# Patient Record
Sex: Female | Born: 1935 | Race: White | Hispanic: No | Smoking: Never smoker
Health system: Southern US, Community
[De-identification: ages and names within clinical notes are randomized; demographics above are authoritative.]

## PROBLEM LIST (undated history)

## (undated) DIAGNOSIS — J45909 Unspecified asthma, uncomplicated: Secondary | ICD-10-CM

## (undated) DIAGNOSIS — I509 Heart failure, unspecified: Secondary | ICD-10-CM

## (undated) DIAGNOSIS — I10 Essential (primary) hypertension: Secondary | ICD-10-CM

## (undated) DIAGNOSIS — I252 Old myocardial infarction: Secondary | ICD-10-CM

## (undated) DIAGNOSIS — Z8679 Personal history of other diseases of the circulatory system: Secondary | ICD-10-CM

## (undated) DIAGNOSIS — I219 Acute myocardial infarction, unspecified: Secondary | ICD-10-CM

## (undated) HISTORY — PX: TONSILLECTOMY: SUR1361

## (undated) HISTORY — PX: APPENDECTOMY: SHX54

## (undated) HISTORY — PX: ABDOMINAL HYSTERECTOMY: SHX81

## (undated) HISTORY — PX: CHOLECYSTECTOMY: SHX55

---

## 2012-09-26 HISTORY — PX: PACEMAKER PLACEMENT: SHX43

## 2012-09-26 HISTORY — PX: AORTIC VALVE REPLACEMENT: SHX41

## 2016-05-25 ENCOUNTER — Emergency Department: Payer: Medicare Other

## 2016-05-25 ENCOUNTER — Encounter: Payer: Self-pay | Admitting: Radiology

## 2016-05-25 ENCOUNTER — Ambulatory Visit (INDEPENDENT_AMBULATORY_CARE_PROVIDER_SITE_OTHER)
Admission: EM | Admit: 2016-05-25 | Discharge: 2016-05-25 | Discharge status: Against Medical Advice | Payer: Medicare Other | Source: Home / Self Care | Attending: Family Medicine | Admitting: Family Medicine

## 2016-05-25 ENCOUNTER — Emergency Department
Admission: EM | Admit: 2016-05-25 | Discharge: 2016-05-25 | Disposition: A | Discharge status: Home / Self Care | Payer: Medicare Other | Attending: Emergency Medicine | Admitting: Emergency Medicine

## 2016-05-25 DIAGNOSIS — M549 Dorsalgia, unspecified: Secondary | ICD-10-CM | POA: Diagnosis not present

## 2016-05-25 DIAGNOSIS — I25119 Atherosclerotic heart disease of native coronary artery with unspecified angina pectoris: Secondary | ICD-10-CM | POA: Insufficient documentation

## 2016-05-25 DIAGNOSIS — R071 Chest pain on breathing: Secondary | ICD-10-CM | POA: Diagnosis present

## 2016-05-25 DIAGNOSIS — Z950 Presence of cardiac pacemaker: Secondary | ICD-10-CM

## 2016-05-25 DIAGNOSIS — I110 Hypertensive heart disease with heart failure: Secondary | ICD-10-CM | POA: Insufficient documentation

## 2016-05-25 DIAGNOSIS — H9203 Otalgia, bilateral: Secondary | ICD-10-CM | POA: Diagnosis not present

## 2016-05-25 DIAGNOSIS — I509 Heart failure, unspecified: Secondary | ICD-10-CM | POA: Insufficient documentation

## 2016-05-25 DIAGNOSIS — R51 Headache: Secondary | ICD-10-CM | POA: Insufficient documentation

## 2016-05-25 DIAGNOSIS — Z954 Presence of other heart-valve replacement: Secondary | ICD-10-CM | POA: Diagnosis not present

## 2016-05-25 DIAGNOSIS — R0602 Shortness of breath: Secondary | ICD-10-CM | POA: Diagnosis not present

## 2016-05-25 DIAGNOSIS — R079 Chest pain, unspecified: Secondary | ICD-10-CM

## 2016-05-25 DIAGNOSIS — Z79899 Other long term (current) drug therapy: Secondary | ICD-10-CM | POA: Insufficient documentation

## 2016-05-25 DIAGNOSIS — Z952 Presence of prosthetic heart valve: Secondary | ICD-10-CM | POA: Insufficient documentation

## 2016-05-25 DIAGNOSIS — J45909 Unspecified asthma, uncomplicated: Secondary | ICD-10-CM | POA: Insufficient documentation

## 2016-05-25 DIAGNOSIS — R0789 Other chest pain: Secondary | ICD-10-CM

## 2016-05-25 HISTORY — DX: Old myocardial infarction: I25.2

## 2016-05-25 HISTORY — DX: Personal history of other diseases of the circulatory system: Z86.79

## 2016-05-25 HISTORY — DX: Unspecified asthma, uncomplicated: J45.909

## 2016-05-25 HISTORY — DX: Heart failure, unspecified: I50.9

## 2016-05-25 HISTORY — DX: Essential (primary) hypertension: I10

## 2016-05-25 LAB — BASIC METABOLIC PANEL
Anion gap: 8 (ref 5–15)
BUN: 17 mg/dL (ref 6–20)
CALCIUM: 9.4 mg/dL (ref 8.9–10.3)
CO2: 25 mmol/L (ref 22–32)
CREATININE: 0.7 mg/dL (ref 0.44–1.00)
Chloride: 107 mmol/L (ref 101–111)
GFR calc Af Amer: >60 mL/min (ref >60)
GFR calc non Af Amer: >60 mL/min (ref >60)
GLUCOSE: 106 mg/dL — AB (ref 65–99)
Potassium: 3.8 mmol/L (ref 3.5–5.1)
Sodium: 140 mmol/L (ref 135–145)

## 2016-05-25 LAB — CBC
HCT: 37.2 % (ref 35.0–47.0)
Hemoglobin: 12.9 g/dL (ref 12.0–16.0)
MCH: 30.5 pg (ref 26.0–34.0)
MCHC: 34.6 g/dL (ref 32.0–36.0)
MCV: 88.2 fL (ref 80.0–100.0)
PLATELETS: 156 10*3/uL (ref 150–440)
RBC: 4.22 MIL/uL (ref 3.80–5.20)
RDW: 15.4 % — ABNORMAL HIGH (ref 11.5–14.5)
WBC: 6 10*3/uL (ref 3.6–11.0)

## 2016-05-25 LAB — TROPONIN I: Troponin I: <0.03 ng/mL (ref <0.03)

## 2016-05-25 LAB — BRAIN NATRIURETIC PEPTIDE: B Natriuretic Peptide: 575 pg/mL — ABNORMAL HIGH (ref 0.0–100.0)

## 2016-05-25 MED ORDER — IOPAMIDOL (ISOVUE-370) INJECTION 76%
75.0000 mL | Freq: Once | INTRAVENOUS | Status: AC | PRN
  Administered 2016-05-25: 75 mL via INTRAVENOUS

## 2016-05-25 MED ORDER — ALBUTEROL SULFATE HFA 108 (90 BASE) MCG/ACT IN AERS: 2.0000 | INHALATION_SPRAY | Freq: Four times a day (QID) | RESPIRATORY_TRACT | 0 refills | Status: AC | PRN

## 2016-05-25 MED ORDER — DOXYCYCLINE HYCLATE 100 MG PO CAPS: 100.0000 mg | ORAL_CAPSULE | Freq: Two times a day (BID) | ORAL | 8 refills | Status: DC

## 2016-05-25 NOTE — ED Triage Notes (Signed)
Patient reports that over the last few days she has not been feeling herself. Patient states that she started with cough, sore throat. Patient states that she has been having chest pain that radiates through to her back and describes this as a pressure. Patient states that she does have A-fib and she has had an Aortic Valve Replacement done in 2014. 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 Patient states that she has been having some shortness of breath as well.

## 2016-05-25 NOTE — ED Triage Notes (Signed)
Pt reports this weekend started with earaches, chest pain, SOB, cough and pain to her shoulder and back. Pt reports went to UC to rule out sinus infection but was advised to come to the ED. Pt reports has cardiac history and intermittently will have the CP and SOB.

## 2016-05-25 NOTE — Discharge Instructions (Signed)
Explained to patient that due to her cardiac history, would recommend evaluation at emergency department; patient refuses transport by ambulance

## 2016-05-25 NOTE — Discharge Instructions (Signed)
Please seek medical attention for any high fevers, chest pain, shortness of breath, change in behavior, persistent vomiting, bloody stool or any other new or concerning symptoms.  

## 2016-05-25 NOTE — ED Provider Notes (Signed)
[facility]    CSN: [id number] Arrival date & time: 05/25/16  1043  First Provider Contact:  First MD Initiated Contact with Patient 05/25/16 1104        History   Chief Complaint Chief Complaint  Patient presents with  . Chest Pain    HPI Deanna HaggisCarol Yates is a 81 y.o. female.   81 yo female with a c/o of sinus congestion and "chest pressure" in the middle of the chest since yesterday, associated with shortness of breath with exertion. Patient also states she's also been feeling palpitations.  Patient has a h/o CAD (s/p MI in 2014), s/p aortic valve replacement and pacemaker in 2014 in [place].   The history is provided by the patient.  Chest Pain    Past Medical History:  Diagnosis Date  . History of atrial fibrillation   . History of myocardial infarction     There are no active problems to display for this patient.   Past Surgical History:  Procedure Laterality Date  . ABDOMINAL HYSTERECTOMY    . AORTIC VALVE REPLACEMENT  2014  . APPENDECTOMY    . CHOLECYSTECTOMY    . PACEMAKER PLACEMENT  2014  . TONSILLECTOMY      OB History    No data available       Home Medications    Prior to Admission medications   Medication Sig Start Date End Date Taking? Authorizing Provider  atorvastatin (LIPITOR) 20 MG tablet Take 20 mg by mouth daily.   Yes Historical Provider, MD  furosemide (LASIX) 40 MG tablet Take 40 mg by mouth 3 (three) times a week.   Yes Historical Provider, MD  lisinopril (PRINIVIL,ZESTRIL) 5 MG tablet Take 5 mg by mouth daily.   Yes Historical Provider, MD  omeprazole (PRILOSEC) 20 MG capsule Take 20 mg by mouth daily.   Yes Historical Provider, MD  potassium chloride (MICRO-K) 10 MEQ CR capsule Take 10 mEq by mouth 2 (two) times daily.   Yes Historical Provider, MD  sotalol (BETAPACE) 120 MG tablet Take 120 mg by mouth 2 (two) times daily.   Yes Historical Provider, MD    Family History No family history on file.  Social  History Social History  Substance Use Topics  . Smoking status: Never Smoker  . Smokeless tobacco: Never Used  . Alcohol use No     Allergies   Cortizone-10 [hydrocortisone] and Penicillins   Review of Systems Review of Systems  Cardiovascular: Positive for chest pain.     Physical Exam Triage Vital Signs ED Triage Vitals  Enc Vitals Group     BP 05/25/16 1055 (!) 145/58     Pulse Rate 05/25/16 1055 92     Resp 05/25/16 1055 16     Temp 05/25/16 1055 98.2 F (36.8 C)     Temp Source 05/25/16 1055 Oral     SpO2 05/25/16 1055 99 %     Weight 05/25/16 1057 165 lb (74.8 kg)     Height 05/25/16 1057 5\' 2"  (1.575 m)     Head Circumference --      Peak Flow --      Pain Score 05/25/16 1101 8     Pain Loc --      Pain Edu? --      Excl. in GC? --    No data found.   Updated Vital Signs BP (!) 145/58 (BP Location: Left Arm)   Pulse 92   Temp 98.2 F (36.8 C) (Oral)  Resp 16   Ht 5\' 2"  (1.575 m)   Wt 165 lb (74.8 kg)   SpO2 99%   BMI 30.18 kg/m   Visual Acuity Right Eye Distance:   Left Eye Distance:   Bilateral Distance:    Right Eye Near:   Left Eye Near:    Bilateral Near:     Physical Exam  Constitutional: She appears well-developed. No distress.  Neck: Neck supple.  Cardiovascular: Normal rate, normal heart sounds and intact distal pulses.   Pulmonary/Chest: Effort normal and breath sounds normal. No respiratory distress. She has no wheezes. She has no rales. She exhibits no tenderness.  Musculoskeletal: She exhibits no edema.  Skin: She is not diaphoretic.  Nursing note and vitals reviewed.    UC Treatments / Results  Labs (all labs ordered are listed, but only abnormal results are displayed) Labs Reviewed - No data to display  EKG  EKG Interpretation None       Radiology Dg Chest 2 View  Result Date: 05/25/2016 CLINICAL DATA:  81 year old female with increasing shortness of breath, congestion wheezing for the past week EXAM: CHEST   2 VIEW COMPARISON:  None. FINDINGS: Cardiomegaly. Mediastinal contours are unremarkable. Surgical changes of prior aortic valve replacement. There is a left subclavian approach cardiac rhythm maintenance device with leads projecting over the right atrium and right ventricle. Additional surgical clips are present in the right hilum. Mild central bronchitic changes and interstitial prominence without evidence of pulmonary edema. No suspicious pulmonary mass or nodule. No pleural effusion. This exaggerated thoracic kyphosis. Surgical clips in a right upper quadrant consistent with prior cholecystectomy. Atherosclerotic calcifications in the transverse aorta. IMPRESSION: 1. Central bronchitic change and mild interstitial prominence favored to reflect chronic parenchymal changes. 2. Cardiomegaly without pulmonary edema. 3.  Aortic Atherosclerosis (ICD10-170.0) 4. Surgical changes of prior aortic valve replacement. 5. Left subclavian approach cardiac rhythm maintenance device. Electronically Signed   By: Malachy Moan M.D.   On: 05/25/2016 12:54    Procedures .EKG Date/Time: 05/25/2016 1:28 PM Performed by: Payton Mccallum Authorized by: Payton Mccallum   ECG reviewed by ED Physician in the absence of a cardiologist: yes   Previous ECG:    Previous ECG:  Unavailable Interpretation:    Interpretation: non-specific   Rate:    ECG rate assessment: normal   Rhythm:    Rhythm: paced   Pacing:    Capture:  Complete   Type of pacing:  Ventricular Ectopy:    Ectopy: none     (including critical care time)  Medications Ordered in UC Medications - No data to display   Initial Impression / Assessment and Plan / UC Course  I have reviewed the triage vital signs and the nursing notes.  Pertinent labs & imaging results that were available during my care of the patient were reviewed by me and considered in my medical decision making (see chart for details).  Clinical Course      Final Clinical  Impressions(s) / UC Diagnoses   Final diagnoses:  Chest tightness or pressure  Coronary artery disease with unspecified angina pectoris  H/O aortic valve replacement  Pacemaker    New Prescriptions Discharge Medication List as of 05/25/2016 11:27 AM      1. EKG results and diagnosis reviewed with patient. Discussed with patient that due to her extensive cardiac history and some of her current symptoms, would recommend further evaluation in the emergency department. Patient refuses transport by ambulance. States will have a friend drive her  over in private vehicle. Verbalizes understanding of potential risks, complications.    Payton Mccallum, MD 05/25/16 [phone]

## 2016-05-25 NOTE — ED Provider Notes (Signed)
[facility] Emergency Department Provider Note  ____________________________________________  Time seen: Approximately 1:10 PM  I have reviewed the triage vital signs and the nursing notes.   HISTORY  Chief Complaint Chest Pain; Shortness of Breath; and Otalgia   HPI Deanna Yates is a 81 y.o. female the history of paroxysmal atrial fibrillation, CAD, aortic valve replacement, mild asthma who presents for evaluation of chest pain. Patient reports that she has had a week of frontal headaches and b/l earache similar to her prior episodes of sinusitis. She reports 3 days of pleuritic chest pain and upper back pain. Patient reports the pain started after she returned from a trip to [place]. She drove to and from [place] and returned 3 days ago. She reports that the pain is dull and strong, located in her upper back between her shoulder blades, radiating to her chest and associated with shortness of breath and chest heaviness. She reports the pain is worse with minimal exacerbation and she has been feeling very short of breath just walking to the bathroom which is unusual for her. She denies any prior history of pulmonary embolism or DVT, and exogenous hormones, leg pain or swelling, or hemoptysis. She also denies any history of cancer. Patient has been taking her Lasix as prescribed however reports that she's been using 2 pillows at home to sleep as she finishes it severely short of breath when laying flat. She denies weight gain. She went to urgent care today and was sent here for further evaluation. She denies fever, chills, bodyaches, cough, sore throat, abdominal pain, nausea, vomiting, dysuria, rash.  Past Medical History:  Diagnosis Date  . Asthma   . CHF (congestive heart failure) (HCC)   . History of atrial fibrillation   . History of myocardial infarction   . Hypertension     There are no active problems to display for this patient.   Past Surgical History:    Procedure Laterality Date  . ABDOMINAL HYSTERECTOMY    . AORTIC VALVE REPLACEMENT  2014  . APPENDECTOMY    . CHOLECYSTECTOMY    . PACEMAKER PLACEMENT  2014  . TONSILLECTOMY      Prior to Admission medications   Medication Sig Start Date End Date Taking? Authorizing Provider  atorvastatin (LIPITOR) 20 MG tablet Take 20 mg by mouth daily.   Yes Historical Provider, MD  furosemide (LASIX) 40 MG tablet Take 40 mg by mouth 3 (three) times a week. Mondays, Wednesdays and Fridays.   Yes Historical Provider, MD  lisinopril (PRINIVIL,ZESTRIL) 5 MG tablet Take 5 mg by mouth daily.   Yes Historical Provider, MD  omeprazole (PRILOSEC) 20 MG capsule Take 20 mg by mouth daily.   Yes Historical Provider, MD  Potassium Chloride ER 20 MEQ TBCR Take 40 mEq by mouth 2 (two) times daily. 3 times a week. Monday, Wednesday, and Friday. When taking furosemide.   Yes Historical Provider, MD  sotalol (BETAPACE) 120 MG tablet Take 120 mg by mouth 2 (two) times daily.   Yes Historical Provider, MD  albuterol (PROVENTIL HFA;VENTOLIN HFA) 108 (90 Base) MCG/ACT inhaler Inhale 2 puffs into the lungs every 6 (six) hours as needed for wheezing or shortness of breath. 05/25/16   Phineas SemenGraydon Goodman, MD  doxycycline (VIBRAMYCIN) 100 MG capsule Take 1 capsule (100 mg total) by mouth 2 (two) times daily. 05/25/16   Phineas SemenGraydon Goodman, MD    Allergies Cortizone-10 [hydrocortisone] and Penicillins  No family history on file.  Social History Social History  Substance Use Topics  . Smoking status: Never Smoker  . Smokeless tobacco: Never Used  . Alcohol use No    Review of Systems  Constitutional: Negative for fever. Eyes: Negative for visual changes. ENT: Negative for sore throat. + ear pain Cardiovascular: + chest pain and orthopnea Respiratory: + shortness of breath. Gastrointestinal: Negative for abdominal pain, vomiting or diarrhea. Genitourinary: Negative for dysuria. Musculoskeletal: + upper back pain. Skin:  Negative for rash. Neurological: Negative for weakness or numbness. + HA  ____________________________________________   PHYSICAL EXAM:  VITAL SIGNS: ED Triage Vitals  Enc Vitals Group     BP 05/25/16 1218 (!) 140/51     Pulse Rate 05/25/16 1218 95     Resp 05/25/16 1218 17     Temp 05/25/16 1218 98.2 F (36.8 C)     Temp Source 05/25/16 1218 Oral     SpO2 05/25/16 1218 97 %     Weight 05/25/16 1219 165 lb (74.8 kg)     Height 05/25/16 1219 5\' 2"  (1.575 m)     Head Circumference --      Peak Flow --      Pain Score --      Pain Loc --      Pain Edu? --      Excl. in GC? --     Constitutional: Alert and oriented. Well appearing and in no apparent distress. HEENT:      Head: Normocephalic and atraumatic.         Eyes: Conjunctivae are normal. Sclera is non-icteric. EOMI. PERRL      Mouth/Throat: Mucous membranes are moist.       Ear: TMs visualized and no evidence of infection      Neck: Supple with no signs of meningismus. Cardiovascular: Regular rate and rhythm. No murmurs, gallops, or rubs. 2+ symmetrical distal pulses are present in all extremities. No JVD. Respiratory: Normal respiratory effort. Lungs are clear to auscultation bilaterally. No wheezes, crackles, or rhonchi.  Gastrointestinal: Soft, non tender, and non distended with positive bowel sounds. No rebound or guarding. Genitourinary: No CVA tenderness. Musculoskeletal: Nontender with normal range of motion in all extremities. No edema, cyanosis, or erythema of extremities. Neurologic: Normal speech and language. Face is symmetric. Moving all extremities. No gross focal neurologic deficits are appreciated. Skin: Skin is warm, dry and intact. No rash noted. Psychiatric: Mood and affect are normal. Speech and behavior are normal.  ____________________________________________   LABS (all labs ordered are listed, but only abnormal results are displayed)  Labs Reviewed  BASIC METABOLIC PANEL - Abnormal; Notable  for the following:       Result Value   Glucose, Bld 106 (*)    All other components within normal limits  CBC - Abnormal; Notable for the following:    RDW 15.4 (*)    All other components within normal limits  BRAIN NATRIURETIC PEPTIDE - Abnormal; Notable for the following:    B Natriuretic Peptide 575.0 (*)    All other components within normal limits  TROPONIN I  TROPONIN I   ____________________________________________  EKG  ED ECG REPORT I, Nita Sickle, the attending physician, personally viewed and interpreted this ECG. AV dual paced rhythm, rate of 95, prolonged QTC of 560, left axis deviation, no ST elevations or depressions. No prior for comparison  ____________________________________________  RADIOLOGY  CXR: Negative ____________________________________________   PROCEDURES  Procedure(s) performed: None Procedures Critical Care performed:  None ____________________________________________   INITIAL IMPRESSION / ASSESSMENT AND PLAN /  ED COURSE  81 y.o. female the history of paroxysmal atrial fibrillation, CAD, aortic valve replacement, mild asthma who presents for evaluation of pleuritic upper back pain associated with chest heaviness and SOB, orthopnea. Patient just returned from a long driving trip to [place]. Patient is well-appearing, in no distress, she is mildly tachycardic with heart rate of 95, remaining of her vital signs are within normal limits, patient has no crackles or wheezing, no JVD, no pitting edema. EKG with no evidence of ischemia. Differential diagnoses including pneumonia versus pulmonary embolism vs CHF. Plan for labs, CTA chest, will watch on telemetry.  Clinical Course  Comment By Time  Patient remains stable. BNP elevated. Troponin x 1 negative. 2nd troponin and CTA pending. Plan to re-assess after CT. If CT negative, 2 troponins are negative and patient is doing well, can consider giving IV lasix and close f/u at CHF clinic  tomorrow. If patient continues to have pain, then admit for CP. If CT positive treat accordingly. Patient will need abx for sinusitis if discharge. Plan and pending studies discussed with Dr. Derrill Kay who assumed care of the patient on 3:53 PM Nita Sickle, MD 08/30 1551    Pertinent labs & imaging results that were available during my care of the patient were reviewed by me and considered in my medical decision making (see chart for details).    ____________________________________________   FINAL CLINICAL IMPRESSION(S) / ED DIAGNOSES  Final diagnoses:  Shortness of breath      NEW MEDICATIONS STARTED DURING THIS VISIT:  Discharge Medication List as of 05/25/2016  6:34 PM    START taking these medications   Details  albuterol (PROVENTIL HFA;VENTOLIN HFA) 108 (90 Base) MCG/ACT inhaler Inhale 2 puffs into the lungs every 6 (six) hours as needed for wheezing or shortness of breath., Starting Wed 05/25/2016, Print    doxycycline (VIBRAMYCIN) 100 MG capsule Take 1 capsule (100 mg total) by mouth 2 (two) times daily., Starting Wed 05/25/2016, Print         Note:  This document was prepared using Dragon voice recognition software and may include unintentional dictation errors.    Nita Sickle, MD 05/26/16 [phone]

## 2016-05-25 NOTE — ED Provider Notes (Signed)
  CTA chest IMPRESSION:  1. No evidence of acute pulmonary embolism or other acute chest  findings.  2. Central enlargement of the pulmonary arteries suspicious for  pulmonary arterial hypertension.  3. Cardiomegaly, diffuse atherosclerosis and mild dilatation of the  ascending aorta. Recommend annual imaging followup by CTA or MRA.  This recommendation follows 2010  ACCF/AHA/AATS/ACR/ASA/SCA/SCAI/SIR/STS/SVM Guidelines for the  Diagnosis and Management of Patients with Thoracic Aortic Disease.  Circulation. 2010; 121: [id number]   2nd troponin negative. Patient was offered admission for chest pain/shortness of breath however declined. She did ask for a new prescription for her albuterol inhaler. Will also discharge with antibiotics for potential sinus infection.   Phineas SemenGraydon Nakya Weyand, MD 05/25/16 2023

## 2016-06-05 ENCOUNTER — Emergency Department
Admission: EM | Admit: 2016-06-05 | Discharge: 2016-06-05 | Disposition: A | Discharge status: Home / Self Care | Payer: Medicare Other | Attending: Emergency Medicine | Admitting: Emergency Medicine

## 2016-06-05 ENCOUNTER — Encounter: Payer: Self-pay | Admitting: Emergency Medicine

## 2016-06-05 ENCOUNTER — Emergency Department: Payer: Medicare Other

## 2016-06-05 DIAGNOSIS — I252 Old myocardial infarction: Secondary | ICD-10-CM | POA: Diagnosis not present

## 2016-06-05 DIAGNOSIS — R0789 Other chest pain: Secondary | ICD-10-CM | POA: Diagnosis present

## 2016-06-05 DIAGNOSIS — I509 Heart failure, unspecified: Secondary | ICD-10-CM | POA: Diagnosis not present

## 2016-06-05 DIAGNOSIS — I110 Hypertensive heart disease with heart failure: Secondary | ICD-10-CM | POA: Diagnosis not present

## 2016-06-05 DIAGNOSIS — J45909 Unspecified asthma, uncomplicated: Secondary | ICD-10-CM | POA: Diagnosis not present

## 2016-06-05 HISTORY — DX: Acute myocardial infarction, unspecified: I21.9

## 2016-06-05 LAB — BASIC METABOLIC PANEL
ANION GAP: 9 (ref 5–15)
BUN: 19 mg/dL (ref 6–20)
CALCIUM: 9.6 mg/dL (ref 8.9–10.3)
CO2: 21 mmol/L — AB (ref 22–32)
CREATININE: 0.65 mg/dL (ref 0.44–1.00)
Chloride: 111 mmol/L (ref 101–111)
GFR calc non Af Amer: >60 mL/min (ref >60)
Glucose, Bld: 89 mg/dL (ref 65–99)
POTASSIUM: 4 mmol/L (ref 3.5–5.1)
SODIUM: 141 mmol/L (ref 135–145)

## 2016-06-05 LAB — TROPONIN I
Troponin I: <0.03 ng/mL (ref <0.03)
Troponin I: <0.03 ng/mL (ref <0.03)

## 2016-06-05 LAB — CBC
HCT: 40.3 % (ref 35.0–47.0)
Hemoglobin: 13.7 g/dL (ref 12.0–16.0)
MCH: 30.1 pg (ref 26.0–34.0)
MCHC: 34 g/dL (ref 32.0–36.0)
MCV: 88.5 fL (ref 80.0–100.0)
PLATELETS: 183 10*3/uL (ref 150–440)
RBC: 4.55 MIL/uL (ref 3.80–5.20)
RDW: 15.3 % — ABNORMAL HIGH (ref 11.5–14.5)
WBC: 7.2 10*3/uL (ref 3.6–11.0)

## 2016-06-05 LAB — PROTIME-INR
INR: 2.05
Prothrombin Time: 23.4 seconds — ABNORMAL HIGH (ref 11.4–15.2)
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
## 2016-06-05 NOTE — ED Triage Notes (Signed)
Pt c/o central chest pain that is heavy in nature. Hx of afib. Pain radiates to neck.  Also c/o headache. Has SHOB wit pain as well.  HX MI, pacemaker, AVR
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
## 2016-06-05 NOTE — Discharge Instructions (Signed)
You were evaluated for chest discomfort and although no certain cause was found, your exam and evaluation are reassuring in the emergency department today. I do suspect that anxiety may have contributed to your chest discomfort.  I would recommend you follow-up with cardiologist, and have given the office number for Dr. Welton FlakesKhan.  I've also included number for primary care physician in this area.  Return to the emergency department for any worsening condition including chest pain, palpitations, lightheadedness, dizziness or passing out, weakness or numbness, nausea, sweats, or any other symptoms concerning to you.

## 2016-07-22 ENCOUNTER — Emergency Department: Payer: No Typology Code available for payment source

## 2016-07-22 ENCOUNTER — Emergency Department
Admission: EM | Admit: 2016-07-22 | Discharge: 2016-07-22 | Disposition: A | Discharge status: Home / Self Care | Payer: No Typology Code available for payment source | Attending: Emergency Medicine | Admitting: Emergency Medicine

## 2016-07-22 ENCOUNTER — Encounter: Payer: Self-pay | Admitting: Emergency Medicine

## 2016-07-22 DIAGNOSIS — S39012A Strain of muscle, fascia and tendon of lower back, initial encounter: Secondary | ICD-10-CM | POA: Diagnosis not present

## 2016-07-22 DIAGNOSIS — J45909 Unspecified asthma, uncomplicated: Secondary | ICD-10-CM | POA: Diagnosis not present

## 2016-07-22 DIAGNOSIS — S20311A Abrasion of right front wall of thorax, initial encounter: Secondary | ICD-10-CM | POA: Insufficient documentation

## 2016-07-22 DIAGNOSIS — Y999 Unspecified external cause status: Secondary | ICD-10-CM | POA: Diagnosis not present

## 2016-07-22 DIAGNOSIS — S3992XA Unspecified injury of lower back, initial encounter: Secondary | ICD-10-CM | POA: Diagnosis present

## 2016-07-22 DIAGNOSIS — Y92410 Unspecified street and highway as the place of occurrence of the external cause: Secondary | ICD-10-CM | POA: Diagnosis not present

## 2016-07-22 DIAGNOSIS — S8011XA Contusion of right lower leg, initial encounter: Secondary | ICD-10-CM

## 2016-07-22 DIAGNOSIS — I110 Hypertensive heart disease with heart failure: Secondary | ICD-10-CM | POA: Diagnosis not present

## 2016-07-22 DIAGNOSIS — Z79899 Other long term (current) drug therapy: Secondary | ICD-10-CM | POA: Insufficient documentation

## 2016-07-22 DIAGNOSIS — S40211A Abrasion of right shoulder, initial encounter: Secondary | ICD-10-CM

## 2016-07-22 DIAGNOSIS — I509 Heart failure, unspecified: Secondary | ICD-10-CM | POA: Diagnosis not present

## 2016-07-22 DIAGNOSIS — Y939 Activity, unspecified: Secondary | ICD-10-CM | POA: Diagnosis not present

## 2016-07-22 LAB — URINALYSIS COMPLETE WITH MICROSCOPIC (ARMC ONLY)
BACTERIA UA: NONE SEEN
Bilirubin Urine: NEGATIVE
Glucose, UA: NEGATIVE mg/dL
KETONES UR: NEGATIVE mg/dL
LEUKOCYTES UA: NEGATIVE
NITRITE: NEGATIVE
PROTEIN: NEGATIVE mg/dL
SPECIFIC GRAVITY, URINE: 1.008 (ref 1.005–1.030)
pH: 6 (ref 5.0–8.0)

## 2016-07-22 MED ORDER — FAMOTIDINE 20 MG PO TABS
40.0000 mg | ORAL_TABLET | Freq: Once | ORAL | Status: AC
  Administered 2016-07-22: 40 mg via ORAL
  Filled 2016-07-22: qty 2

## 2016-07-22 MED ORDER — ONDANSETRON 4 MG PO TBDP
4.0000 mg | ORAL_TABLET | Freq: Once | ORAL | Status: AC
  Administered 2016-07-22: 4 mg via ORAL
  Filled 2016-07-22: qty 1

## 2016-07-22 MED ORDER — ACETAMINOPHEN 500 MG PO TABS
500.0000 mg | ORAL_TABLET | Freq: Once | ORAL | Status: AC
  Administered 2016-07-22: 500 mg via ORAL
  Filled 2016-07-22: qty 1

## 2016-07-22 MED ORDER — TRAMADOL HCL 50 MG PO TABS: 50.0000 mg | ORAL_TABLET | Freq: Three times a day (TID) | ORAL | 0 refills | Status: DC | PRN

## 2016-07-22 MED ORDER — HYDROCODONE-ACETAMINOPHEN 5-325 MG PO TABS
1.0000 | ORAL_TABLET | Freq: Once | ORAL | Status: AC
  Administered 2016-07-22: 1 via ORAL
  Filled 2016-07-22: qty 1

## 2016-07-22 MED ORDER — TRAMADOL HCL 50 MG PO TABS
50.0000 mg | ORAL_TABLET | Freq: Once | ORAL | Status: AC
  Administered 2016-07-22: 50 mg via ORAL
  Filled 2016-07-22: qty 1

## 2016-07-22 MED ORDER — BACITRACIN ZINC 500 UNIT/GM EX OINT
TOPICAL_OINTMENT | Freq: Once | CUTANEOUS | Status: AC
  Administered 2016-07-22: 1 via TOPICAL
  Filled 2016-07-22: qty 0.9

## 2016-07-22 MED ORDER — ONDANSETRON 4 MG PO TBDP: 4.0000 mg | ORAL_TABLET | Freq: Four times a day (QID) | ORAL | 0 refills | Status: DC | PRN

## 2016-07-22 MED ORDER — HYDROCODONE-ACETAMINOPHEN 5-325 MG PO TABS: 1.0000 | ORAL_TABLET | Freq: Three times a day (TID) | ORAL | 0 refills | Status: DC | PRN
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
## 2016-07-22 NOTE — ED Notes (Signed)
Reviewed d/c instructions, prescriptions, follow-up care with pt. Pt verbalized understanding

## 2016-07-22 NOTE — ED Notes (Signed)
Pt denies vomiting, however reports nausea has not resolved. PA informed

## 2016-07-22 NOTE — ED Notes (Signed)
This RN helped pt move up in bed. Also put knees up on stretcher to take pressure off low back and provided pt with pillow.

## 2016-07-22 NOTE — ED Notes (Signed)
Pt reports that she still has some nausea after the zofran, however it is improved. Pt given ordered pain medication and provided with saltine cracker to eat with medication to help with nausea associated with narcotic pain medications. Pt reports that she is more comfortable since being repositioned in bed.
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
## 2016-07-22 NOTE — ED Notes (Signed)
Liborio NixonJanice, [place] called to bedside due to patient beginning to vomit when being sat on side of bed. Pt states hx of nausea when in pain.
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
## 2019-06-05 ENCOUNTER — Ambulatory Visit
Admission: EM | Admit: 2019-06-05 | Discharge: 2019-06-05 | Disposition: A | Discharge status: Home / Self Care | Payer: Medicare Other | Attending: Family Medicine | Admitting: Family Medicine

## 2019-06-05 ENCOUNTER — Encounter: Payer: Self-pay | Admitting: Emergency Medicine

## 2019-06-05 ENCOUNTER — Other Ambulatory Visit: Payer: Self-pay

## 2019-06-05 DIAGNOSIS — H00012 Hordeolum externum right lower eyelid: Secondary | ICD-10-CM | POA: Diagnosis not present

## 2019-06-05 MED ORDER — POLYMYXIN B-TRIMETHOPRIM 10000-0.1 UNIT/ML-% OP SOLN: 1.0000 [drp] | Freq: Four times a day (QID) | OPHTHALMIC | 0 refills | Status: AC

## 2019-06-05 NOTE — ED Provider Notes (Signed)
[facility]    CSN: [id number] Arrival date & time: 06/05/19  1048  History   Chief Complaint Chief Complaint  Patient presents with  . Stye   HPI  84 year old female presents with the above complaint.  Patient reports that she noticed a bump on her right lower eyelid approximately 2 days ago.  It is painful.  She rates her pain is 8/10 in severity currently.  She has tried some over-the-counter eyedrops without resolution.  No vision changes.  No known inciting factor.  No known exacerbating factors.  No other associated symptoms.  Past Medical History:  Diagnosis Date  . Asthma   . CHF (congestive heart failure) ([place])   . History of atrial fibrillation   . History of myocardial infarction   . Hypertension   . MI (myocardial infarction) ([place])   Cystocele  Past Surgical History:  Procedure Laterality Date  . ABDOMINAL HYSTERECTOMY    . AORTIC VALVE REPLACEMENT  2014  . APPENDECTOMY    . CHOLECYSTECTOMY    . PACEMAKER PLACEMENT  2014  . TONSILLECTOMY     OB History   No obstetric history on file.    Home Medications    Prior to Admission medications   Medication Sig Start Date End Date Taking? Authorizing Provider  albuterol (PROVENTIL HFA;VENTOLIN HFA) 108 (90 Base) MCG/ACT inhaler Inhale 2 puffs into the lungs every 6 (six) hours as needed for wheezing or shortness of breath. 05/25/16  Yes Nance Pear, MD  amiodarone (PACERONE) 200 MG tablet Take 200 mg by mouth daily.   Yes [provider]  apixaban (ELIQUIS) 5 MG TABS tablet TK 1 T PO BID 11/03/16  Yes [provider]  atorvastatin (LIPITOR) 20 MG tablet Take 20 mg by mouth daily.   Yes [provider]  carvedilol (COREG) 6.25 MG tablet Take 6.25 mg by mouth 2 (two) times daily with a meal.   Yes [provider]  clopidogrel (PLAVIX) 75 MG tablet Take 75 mg by mouth daily.   Yes [provider]  furosemide (LASIX) 40 MG tablet Take 40 mg by mouth 3 (three)  times a week. Mondays, Wednesdays and Fridays.   Yes [provider]  lisinopril (PRINIVIL,ZESTRIL) 5 MG tablet Take 5 mg by mouth daily.   Yes [provider]  omeprazole (PRILOSEC) 20 MG capsule Take 20 mg by mouth daily.   Yes [provider]  Potassium Chloride ER 20 MEQ TBCR Take 40 mEq by mouth 2 (two) times daily. 3 times a week. Monday, Wednesday, and Friday. When taking furosemide.   Yes [provider]  sertraline (ZOLOFT) 25 MG tablet Take 25 mg by mouth daily.   Yes [provider]  trimethoprim-polymyxin b (POLYTRIM) ophthalmic solution Place 1 drop into the right eye every 6 (six) hours for 7 days. 06/05/19 06/12/19  Coral Spikes, DO  sotalol (BETAPACE) 120 MG tablet Take 120 mg by mouth 2 (two) times daily.  06/05/19  [provider]   Family Hx: No Known Problems Father    Cancer Mother    Coronary Artery Disease (Blocked arteries around heart) Mother    Coronary Artery Disease (Blocked arteries around heart)      Social History Social History   Tobacco Use  . Smoking status: Never Smoker  . Smokeless tobacco: Never Used  Substance Use Topics  . Alcohol use: No  . Drug use: No     Allergies   Cortizone-10 [hydrocortisone] and Penicillins  Review of Systems Review of Systems  Constitutional: Negative.   Eyes: Negative for visual disturbance.       Stye.   Physical Exam Triage Vital Signs ED Triage Vitals  Enc Vitals Group     BP 06/05/19 1146 (!) 128/58     Pulse Rate 06/05/19 1146 75     Resp 06/05/19 1146 18     Temp 06/05/19 1146 98.2 F (36.8 C)     Temp Source 06/05/19 1146 Oral     SpO2 06/05/19 1146 97 %     Weight 06/05/19 1140 150 lb (68 kg)     Height 06/05/19 1140 5\' 3"  (1.6 m)     Head Circumference --      Peak Flow --      Pain Score 06/05/19 1139 8     Pain Loc --      Pain Edu? --      Excl. in GC? --    Updated Vital Signs BP (!) 128/58 (BP Location: Right Arm)   Pulse 75    Temp 98.2 F (36.8 C) (Oral)   Resp 18   Ht 5\' 3"  (1.6 m)   Wt 68 kg   SpO2 97%   BMI 26.57 kg/m   Visual Acuity Right Eye Distance:   Left Eye Distance:   Bilateral Distance:    Right Eye Near:   Left Eye Near:    Bilateral Near:     Physical Exam Vitals signs and nursing note reviewed.  Constitutional:      General: She is not in acute distress.    Appearance: Normal appearance.  HENT:     Head: Normocephalic and atraumatic.  Eyes:     General:        Right eye: No discharge.        Left eye: No discharge.     Conjunctiva/sclera: Conjunctivae normal.      Comments: Stye noted that labeled location.  No conjunctival injection.  No discharge.  Cardiovascular:     Rate and Rhythm: Normal rate and regular rhythm.  Pulmonary:     Effort: Pulmonary effort is normal. No respiratory distress.     Breath sounds: No wheezing, rhonchi or rales.  Neurological:     Mental Status: She is alert.  Psychiatric:        Mood and Affect: Mood normal.        Behavior: Behavior normal.    UC Treatments / Results  Labs (all labs ordered are listed, but only abnormal results are displayed) Labs Reviewed - No data to display  EKG   Radiology No results found.  Procedures Procedures (including critical care time)  Medications Ordered in UC Medications - No data to display  Initial Impression / Assessment and Plan / UC Course  I have reviewed the triage vital signs and the nursing notes.  Pertinent labs & imaging results that were available during my care of the patient were reviewed by me and considered in my medical decision making (see chart for details).    84 year old female presents with an external hordeolum.  Warm compresses.  Polytrim as directed.  Supportive care.  Final Clinical Impressions(s) / UC Diagnoses   Final diagnoses:  Hordeolum externum of right lower eyelid   Discharge Instructions   None    ED Prescriptions    Medication Sig Dispense  Auth. Provider   trimethoprim-polymyxin b (POLYTRIM) ophthalmic solution Place 1 drop into the right eye every 6 (six) hours for 7  days. 10 mL Tommie Sams, DO     Controlled Substance Prescriptions [place] Controlled Substance Registry consulted? Not Applicable   Tommie Sams, DO 06/05/19 1221

## 2019-06-05 NOTE — ED Triage Notes (Signed)
Patient c/o possible stye to her right lower lid that started 2 days ago. She states the area is painful.

## 2019-11-09 ENCOUNTER — Other Ambulatory Visit: Payer: Self-pay

## 2019-11-09 ENCOUNTER — Ambulatory Visit
Admission: EM | Admit: 2019-11-09 | Discharge: 2019-11-09 | Disposition: A | Discharge status: Home / Self Care | Payer: Medicare Other | Attending: Family Medicine | Admitting: Family Medicine

## 2019-11-09 DIAGNOSIS — M62830 Muscle spasm of back: Secondary | ICD-10-CM | POA: Diagnosis not present

## 2019-11-09 DIAGNOSIS — J0100 Acute maxillary sinusitis, unspecified: Secondary | ICD-10-CM | POA: Diagnosis not present

## 2019-11-09 MED ORDER — DOXYCYCLINE HYCLATE 100 MG PO CAPS: 100.0000 mg | ORAL_CAPSULE | Freq: Two times a day (BID) | ORAL | 0 refills | Status: DC

## 2019-11-09 MED ORDER — BACLOFEN 10 MG PO TABS: 5.0000 mg | ORAL_TABLET | Freq: Three times a day (TID) | ORAL | 0 refills | Status: DC | PRN

## 2019-11-09 NOTE — ED Triage Notes (Signed)
Pt presents with c/o lower back muscle spasms for several days. She also reports sinus congestion for several weeks, along with some occasional bilateral ear pain. She denies cough, fever/chills, shob, n/v/d or other symptoms. She denies any known sick contacts.

## 2019-11-09 NOTE — ED Provider Notes (Addendum)
[facility]    CSN: [id number] Arrival date & time: 11/09/19  1423      History   Chief Complaint Chief Complaint  Patient presents with  . Spasms    lower back  . Sinus Problem   HPI   85 year old female presents with the above complaints.  Patient reports for the past 3 to 4 days she has had lower thoracic/upper lumbar muscle spasm on the left side.  She states that this happens to her approximately once a year.  Feels tight and like a muscle spasm.  She is tried heat with no relief.  No recent fall, trauma, injury.  She is requesting medication for this today.  Additionally, over the past several weeks she has had ongoing sinus pressure and congestion.  Has been worse over the past 4 days.  Associated bilateral ear pain.  Has history of sinusitis.  Believes she has a sinus infection.  No relieving factors.  No other associated symptoms.  No other complaints.  Past Medical History:  Diagnosis Date  . Asthma   . CHF (congestive heart failure) ([facility])   . History of atrial fibrillation   . History of myocardial infarction   . Hypertension   . MI (myocardial infarction) [facility])    Past Surgical History:  Procedure Laterality Date  . ABDOMINAL HYSTERECTOMY    . AORTIC VALVE REPLACEMENT  2014  . APPENDECTOMY    . CHOLECYSTECTOMY    . PACEMAKER PLACEMENT  2014  . TONSILLECTOMY      OB History   No obstetric history on file.    Home Medications    Prior to Admission medications   Medication Sig Start Date End Date Taking? Authorizing Provider  albuterol (PROVENTIL HFA;VENTOLIN HFA) 108 (90 Base) MCG/ACT inhaler Inhale 2 puffs into the lungs every 6 (six) hours as needed for wheezing or shortness of breath. 05/25/16  Yes Phineas Semen, MD  amiodarone (PACERONE) 200 MG tablet Take 200 mg by mouth daily.   Yes [provider]  apixaban (ELIQUIS) 5 MG TABS tablet TK 1 T PO BID 11/03/16  Yes [provider]  atorvastatin (LIPITOR) 20 MG tablet  Take 20 mg by mouth daily.   Yes [provider]  carvedilol (COREG) 6.25 MG tablet Take 6.25 mg by mouth 2 (two) times daily with a meal.   Yes [provider]  clopidogrel (PLAVIX) 75 MG tablet Take 75 mg by mouth daily.   Yes [provider]  furosemide (LASIX) 40 MG tablet Take 40 mg by mouth 3 (three) times a week. Mondays, Wednesdays and Fridays.   Yes [provider]  lisinopril (PRINIVIL,ZESTRIL) 5 MG tablet Take 5 mg by mouth daily.   Yes [provider]  omeprazole (PRILOSEC) 20 MG capsule Take 20 mg by mouth daily.   Yes [provider]  Potassium Chloride ER 20 MEQ TBCR Take 40 mEq by mouth 2 (two) times daily. 3 times a week. Monday, Wednesday, and Friday. When taking furosemide.   Yes [provider]  sertraline (ZOLOFT) 25 MG tablet Take 25 mg by mouth daily.   Yes [provider]  baclofen (LIORESAL) 10 MG tablet Take 0.5-1 tablets (5-10 mg total) by mouth 3 (three) times daily as needed for muscle spasms. 11/09/19   Tommie Sams, DO  doxycycline (VIBRAMYCIN) 100 MG capsule Take 1 capsule (100 mg total) by mouth 2 (two) times daily. 11/09/19   Tommie Sams, DO  sotalol (BETAPACE) 120 MG tablet  Take 120 mg by mouth 2 (two) times daily.  06/05/19  [provider]   Social History Social History   Tobacco Use  . Smoking status: Never Smoker  . Smokeless tobacco: Never Used  Substance Use Topics  . Alcohol use: No  . Drug use: No    Allergies   Cortizone-10 [hydrocortisone] and Penicillins   Review of Systems Review of Systems  Constitutional: Negative.   HENT: Positive for congestion, sinus pressure and sinus pain.   Musculoskeletal: Positive for back pain.   Physical Exam Triage Vital Signs ED Triage Vitals  Enc Vitals Group     BP 11/09/19 1441 (!) 145/67     Pulse Rate 11/09/19 1441 61     Resp --      Temp 11/09/19 1441 97.8 F (36.6 C)     Temp Source 11/09/19 1441 Oral     SpO2  11/09/19 1441 99 %     Weight 11/09/19 1438 147 lb (66.7 kg)     Height 11/09/19 1438 5\' 2"  (1.575 m)     Head Circumference --      Peak Flow --      Pain Score 11/09/19 1437 9     Pain Loc --      Pain Edu? --      Excl. in GC? --    Updated Vital Signs BP (!) 145/67 (BP Location: Left Arm)   Pulse 61   Temp 97.8 F (36.6 C) (Oral)   Ht 5\' 2"  (1.575 m)   Wt 66.7 kg   SpO2 99%   BMI 26.89 kg/m   Visual Acuity Right Eye Distance:   Left Eye Distance:   Bilateral Distance:    Right Eye Near:   Left Eye Near:    Bilateral Near:     Physical Exam Vitals and nursing note reviewed.  Constitutional:      General: She is not in acute distress.    Appearance: Normal appearance. She is not ill-appearing.  HENT:     Head: Normocephalic and atraumatic.     Right Ear: Tympanic membrane normal.     Left Ear: Tympanic membrane normal.     Nose:     Comments: Maxillary sinus tenderness to palpation. Eyes:     General:        Right eye: No discharge.        Left eye: No discharge.     Conjunctiva/sclera: Conjunctivae normal.  Cardiovascular:     Rate and Rhythm: Normal rate and regular rhythm.     Heart sounds: Murmur present.  Pulmonary:     Effort: Pulmonary effort is normal.     Breath sounds: Normal breath sounds. No wheezing, rhonchi or rales.  Musculoskeletal:     Comments: Lower thoracic spine - Left-sided muscle spasm  Neurological:     Mental Status: She is alert.  Psychiatric:        Mood and Affect: Mood normal.        Behavior: Behavior normal.    UC Treatments / Results  Labs (all labs ordered are listed, but only abnormal results are displayed) Labs Reviewed - No data to display  EKG   Radiology No results found.  Procedures Procedures (including critical care time)  Medications Ordered in UC Medications - No data to display  Initial Impression / Assessment and Plan / UC Course  I have reviewed the triage vital signs and the nursing  notes.  Pertinent labs & imaging results that  were available during my care of the patient were reviewed by me and considered in my medical decision making (see chart for details).    85 year old female presents with back spasm and sinusitis.  Doxycycline as prescribed.  Baclofen as directed.  Supportive care.  Final Clinical Impressions(s) / UC Diagnoses   Final diagnoses:  Back spasm  Acute maxillary sinusitis, recurrence not specified     Discharge Instructions     Rest.  Heat.  Medications as prescribed.  Take care  Dr. Lacinda Axon    ED Prescriptions    Medication Sig Dispense Auth. Provider   doxycycline (VIBRAMYCIN) 100 MG capsule Take 1 capsule (100 mg total) by mouth 2 (two) times daily. 14 capsule Hassen Bruun G, DO   baclofen (LIORESAL) 10 MG tablet Take 0.5-1 tablets (5-10 mg total) by mouth 3 (three) times daily as needed for muscle spasms. 41 each Coral Spikes, DO     PDMP not reviewed this encounter.    Coral Spikes, [place] 11/09/19 1555

## 2019-11-09 NOTE — Discharge Instructions (Signed)
Rest.  Heat.  Medications as prescribed.  Take care  Dr. Wanda Rideout  

## 2019-11-14 ENCOUNTER — Telehealth: Payer: Self-pay | Admitting: Family Medicine

## 2019-11-14 MED ORDER — CEFDINIR 300 MG PO CAPS: 300.0000 mg | ORAL_CAPSULE | Freq: Two times a day (BID) | ORAL | 0 refills | Status: DC

## 2019-11-14 MED ORDER — TRAMADOL HCL 50 MG PO TABS: 50.0000 mg | ORAL_TABLET | Freq: Two times a day (BID) | ORAL | 0 refills | Status: AC | PRN

## 2019-11-14 NOTE — Telephone Encounter (Signed)
Have adverse reaction to Doxy. Sending in [place]. Still having pain from spasm. PRN Tramadol sent.

## 2019-12-02 ENCOUNTER — Other Ambulatory Visit: Payer: Self-pay

## 2019-12-02 ENCOUNTER — Ambulatory Visit
Admission: EM | Admit: 2019-12-02 | Discharge: 2019-12-02 | Disposition: A | Discharge status: Home / Self Care | Payer: Medicare Other | Attending: Internal Medicine | Admitting: Internal Medicine

## 2019-12-02 DIAGNOSIS — M7918 Myalgia, other site: Secondary | ICD-10-CM | POA: Diagnosis present

## 2019-12-02 DIAGNOSIS — R82998 Other abnormal findings in urine: Secondary | ICD-10-CM | POA: Diagnosis present

## 2019-12-02 LAB — CBC WITH DIFFERENTIAL/PLATELET
Abs Immature Granulocytes: 0.01 10*3/uL (ref 0.00–0.07)
Basophils Absolute: 0.1 10*3/uL (ref 0.0–0.1)
Basophils Relative: 1 %
Eosinophils Absolute: 0 10*3/uL (ref 0.0–0.5)
Eosinophils Relative: 1 %
HCT: 37.3 % (ref 36.0–46.0)
Hemoglobin: 11.6 g/dL — ABNORMAL LOW (ref 12.0–15.0)
Immature Granulocytes: 0 %
Lymphocytes Relative: 21 %
Lymphs Abs: 1.1 10*3/uL (ref 0.7–4.0)
MCH: 27.8 pg (ref 26.0–34.0)
MCHC: 31.1 g/dL (ref 30.0–36.0)
MCV: 89.2 fL (ref 80.0–100.0)
Monocytes Absolute: 0.4 10*3/uL (ref 0.1–1.0)
Monocytes Relative: 7 %
Neutro Abs: 3.7 10*3/uL (ref 1.7–7.7)
Neutrophils Relative %: 70 %
Platelets: 188 10*3/uL (ref 150–400)
RBC: 4.18 MIL/uL (ref 3.87–5.11)
RDW: 14.3 % (ref 11.5–15.5)
WBC: 5.3 10*3/uL (ref 4.0–10.5)
nRBC: 0 % (ref 0.0–0.2)

## 2019-12-02 LAB — URINALYSIS, COMPLETE (UACMP) WITH MICROSCOPIC
Glucose, UA: NEGATIVE mg/dL
Hgb urine dipstick: NEGATIVE
Ketones, ur: NEGATIVE mg/dL
Leukocytes,Ua: NEGATIVE
Nitrite: NEGATIVE
Protein, ur: 30 mg/dL — AB
RBC / HPF: NONE SEEN RBC/hpf (ref 0–5)
Specific Gravity, Urine: >1.03 — ABNORMAL HIGH (ref 1.005–1.030)
pH: 5.5 (ref 5.0–8.0)

## 2019-12-02 LAB — BASIC METABOLIC PANEL
Anion gap: 12 (ref 5–15)
BUN: 18 mg/dL (ref 8–23)
CO2: 26 mmol/L (ref 22–32)
Calcium: 9.2 mg/dL (ref 8.9–10.3)
Chloride: 98 mmol/L (ref 98–111)
Creatinine, Ser: 0.85 mg/dL (ref 0.44–1.00)
GFR calc Af Amer: >60 mL/min (ref >60)
GFR calc non Af Amer: >60 mL/min (ref >60)
Glucose, Bld: 110 mg/dL — ABNORMAL HIGH (ref 70–99)
Potassium: 3.1 mmol/L — ABNORMAL LOW (ref 3.5–5.1)
Sodium: 136 mmol/L (ref 135–145)

## 2019-12-02 LAB — URIC ACID: Uric Acid, Serum: 4.2 mg/dL (ref 2.5–7.1)

## 2019-12-02 MED ORDER — BACLOFEN 5 MG PO TABS: 5.0000 mg | ORAL_TABLET | Freq: Three times a day (TID) | ORAL | 0 refills | Status: DC | PRN

## 2019-12-02 MED ORDER — ACETAMINOPHEN 500 MG PO TABS: 500.0000 mg | ORAL_TABLET | Freq: Four times a day (QID) | ORAL | 0 refills | Status: AC | PRN

## 2019-12-02 NOTE — ED Triage Notes (Signed)
Pt presents with dizziness x at least one week.  Pt reports feeling unsteady/off balance.  Denies CP, SOB, diaphoresis.  Reports fatigue.  States she feels like something is wrong.    Also concerned with lower back spasms x 3 days.  Has some relief with heat.  Reports brownish colored urine but denies frequency, burning, etc.

## 2019-12-02 NOTE — ED Provider Notes (Addendum)
[facility]    CSN: [id number] Arrival date & time: 12/02/19  1245      History   Chief Complaint Chief Complaint  Patient presents with  . Dizziness    HPI Deanna Yates is a 85 y.o. female comes to urgent care with complaints of left-sided back pain of 1 week duration.  Pain is sharp, constant and of moderate severity.  It is aggravated by moving in a certain way.  Back pain was initially relieved by heating pad but subsequently heating pad did not work after she left it on for 2 hours..  Patient denies a fall or any heavy lifting.  No fever or chills.  No rash on the back.  No radiation of pain.  Patient also complains of associated generalized weakness and some unsteady gait.  No dysuria, urgency or frequency.  Patient says that urine is brown in the morning but tends to clear up later in the day.  No nausea or vomiting.   No weight loss.  No easy bruising or bleeding  HPI  Past Medical History:  Diagnosis Date  . Asthma   . CHF (congestive heart failure) ([place])   . History of atrial fibrillation   . History of myocardial infarction   . Hypertension   . MI (myocardial infarction) ([place])     There are no problems to display for this patient.   Past Surgical History:  Procedure Laterality Date  . ABDOMINAL HYSTERECTOMY    . AORTIC VALVE REPLACEMENT  2014  . APPENDECTOMY    . CHOLECYSTECTOMY    . PACEMAKER PLACEMENT  2014  . TONSILLECTOMY      OB History   No obstetric history on file.      Home Medications    Prior to Admission medications   Medication Sig Start Date End Date Taking? Authorizing Provider  albuterol (PROVENTIL HFA;VENTOLIN HFA) 108 (90 Base) MCG/ACT inhaler Inhale 2 puffs into the lungs every 6 (six) hours as needed for wheezing or shortness of breath. 05/25/16  Yes Nance Pear, MD  amiodarone (PACERONE) 200 MG tablet Take 200 mg by mouth daily.   Yes [provider]  apixaban (ELIQUIS) 5 MG TABS tablet TK 1 T PO BID 11/03/16   Yes [provider]  atorvastatin (LIPITOR) 20 MG tablet Take 20 mg by mouth daily.   Yes [provider]  carvedilol (COREG) 6.25 MG tablet Take 6.25 mg by mouth 2 (two) times daily with a meal.   Yes [provider]  clopidogrel (PLAVIX) 75 MG tablet Take 75 mg by mouth daily.   Yes [provider]  furosemide (LASIX) 40 MG tablet Take 40 mg by mouth 3 (three) times a week. Mondays, Wednesdays and Fridays.   Yes [provider]  lisinopril (PRINIVIL,ZESTRIL) 5 MG tablet Take 5 mg by mouth daily.   Yes [provider]  omeprazole (PRILOSEC) 20 MG capsule Take 20 mg by mouth daily.   Yes [provider]  Potassium Chloride ER 20 MEQ TBCR Take 40 mEq by mouth 2 (two) times daily. 3 times a week. Monday, Wednesday, and Friday. When taking furosemide.   Yes [provider]  sertraline (ZOLOFT) 25 MG tablet Take 25 mg by mouth daily.   Yes [provider]  traMADol (ULTRAM) 50 MG tablet Take 1 tablet (50 mg total) by mouth every 12 (twelve) hours as needed. 11/14/19  Yes Cook, Jayce G, DO  acetaminophen (TYLENOL) 500 MG tablet Take 1 tablet (500 mg  total) by mouth every 6 (six) hours as needed. 12/02/19   Michalle Rademaker, Britta Mccreedy, MD  baclofen 5 MG TABS Take 5 mg by mouth 3 (three) times daily as needed for up to 7 days for muscle spasms. 12/02/19 12/09/19  Merrilee Jansky, MD  sotalol (BETAPACE) 120 MG tablet Take 120 mg by mouth 2 (two) times daily.  06/05/19  [provider]    Family History History reviewed. No pertinent family history.  Social History Social History   Tobacco Use  . Smoking status: Never Smoker  . Smokeless tobacco: Never Used  Substance Use Topics  . Alcohol use: No  . Drug use: No     Allergies   Cortizone-10 [hydrocortisone] and Penicillins   Review of Systems Review of Systems  Constitutional: Positive for activity change. Negative for chills and fatigue.  HENT: Negative.     Gastrointestinal: Negative.   Genitourinary: Negative for decreased urine volume, frequency and vaginal bleeding.  Musculoskeletal: Positive for back pain and gait problem. Negative for joint swelling and myalgias.  Neurological: Negative for facial asymmetry, numbness and headaches.  Psychiatric/Behavioral: Negative for confusion.     Physical Exam Triage Vital Signs ED Triage Vitals  Enc Vitals Group     BP 12/02/19 1314 140/87     Pulse Rate 12/02/19 1314 65     Resp 12/02/19 1314 18     Temp 12/02/19 1314 97.7 F (36.5 C)     Temp Source 12/02/19 1314 Oral     SpO2 12/02/19 1314 97 %     Weight --      Height --      Head Circumference --      Peak Flow --      Pain Score 12/02/19 1307 8     Pain Loc --      Pain Edu? --      Excl. in GC? --    No data found.  Updated Vital Signs BP 140/87 (BP Location: Right Arm)   Pulse 65   Temp 97.7 F (36.5 C) (Oral)   Resp 18   SpO2 97%   Visual Acuity Right Eye Distance:   Left Eye Distance:   Bilateral Distance:    Right Eye Near:   Left Eye Near:    Bilateral Near:     Physical Exam Vitals and nursing note reviewed.  Constitutional:      General: She is not in acute distress.    Appearance: She is ill-appearing.  Cardiovascular:     Rate and Rhythm: Normal rate and regular rhythm.     Pulses: Normal pulses.     Heart sounds: Normal heart sounds. No murmur. No friction rub.  Pulmonary:     Effort: Pulmonary effort is normal.     Breath sounds: Normal breath sounds.  Abdominal:     General: Bowel sounds are normal.     Palpations: Abdomen is soft.  Musculoskeletal:        General: Tenderness present. Normal range of motion.     Comments: Tenderness over the left paraspinal muscle spanning the lumbar vertebral area.  Full range of motion with pain in the back.  Power is 5/5 in the lower extremities.  Deep tendon reflexes are 2+.  Neurological:     Mental Status: She is alert.      UC Treatments /  Results  Labs (all labs ordered are listed, but only abnormal results are displayed) Labs Reviewed  URINALYSIS, COMPLETE (UACMP) WITH MICROSCOPIC  EKG   Radiology No results found.  Procedures Procedures (including critical care time)  Medications Ordered in UC Medications - No data to display  Initial Impression / Assessment and Plan / UC Course  I have reviewed the triage vital signs and the nursing notes.  Pertinent labs & imaging results that were available during my care of the patient were reviewed by me and considered in my medical decision making (see chart for details).     1.  Musculoskeletal pain in the left paraspinal muscle: EKG showed a paced rhythm Point-of-care urinalysis Tylenol 500 mg as needed for pain Baclofen 5 mg every 8 hours as needed for muscle spasm Heat therapy teaching given Return precautions given Urine as concentrated with specific gravity of greater than 1.030, uric acid crystals present as well as calcium oxalate. CBC, BMP, serum uric acid level Patient will follow up with primary care physician regarding poor sleep at night Final Clinical Impressions(s) / UC Diagnoses   Final diagnoses:  Pain of paraspinal muscle   Discharge Instructions   None    ED Prescriptions    Medication Sig Dispense Auth. Provider   baclofen 5 MG TABS Take 5 mg by mouth 3 (three) times daily as needed for up to 7 days for muscle spasms. 10 tablet Paulita Licklider, Britta Mccreedy, MD   acetaminophen (TYLENOL) 500 MG tablet Take 1 tablet (500 mg total) by mouth every 6 (six) hours as needed. 30 tablet Ad Guttman, Britta Mccreedy, MD     PDMP not reviewed this encounter.   Merrilee Jansky, MD 12/02/19 1346    Merrilee Jansky, MD 12/02/19 [phone]

## 2019-12-06 ENCOUNTER — Ambulatory Visit
Admission: EM | Admit: 2019-12-06 | Discharge: 2019-12-06 | Disposition: A | Discharge status: Home / Self Care | Payer: Medicare Other | Attending: Family Medicine | Admitting: Family Medicine

## 2019-12-06 ENCOUNTER — Other Ambulatory Visit: Payer: Self-pay

## 2019-12-06 DIAGNOSIS — M545 Low back pain, unspecified: Secondary | ICD-10-CM

## 2019-12-06 DIAGNOSIS — M62830 Muscle spasm of back: Secondary | ICD-10-CM

## 2019-12-06 MED ORDER — TIZANIDINE HCL 2 MG PO TABS: 2.0000 mg | ORAL_TABLET | Freq: Three times a day (TID) | ORAL | 0 refills | Status: AC | PRN

## 2019-12-06 NOTE — ED Provider Notes (Signed)
[facility]    CSN: [id number] Arrival date & time: 12/06/19  1538      History   Chief Complaint Chief Complaint  Patient presents with   Back Pain    HPI Deanna Yates is a 85 y.o. female.   Deanna Yates presents with complaints of persistent left sided back pain. Started suddenly 4 days ago without injury. Worse with certain movements and activities. Applying heat significantly helps. Feels like it spasms. Mid back and then radiates to low back and to left scapula area. She feels like it is worse as she has to climb stairs to get to her bedroom- she is staying with her daughter. She feels she has to pull herself up the stairs somewhat because they are steep. She is otherwise typically fairly active around the home, helping with house work. No numbness tingling or weakness to arm or leg to left side. Has been taking tramadol every 12 hours, this has not helped. She was seen here 3/8 and evaluated with blood work, urine, and ekg, which essentially were found non contributory to symptoms. She was provided with baclofen. It does not sound like she has taken this at all. She was seen her 2/13 with what sounds like same symptoms as well. No rash. No new urinary symptoms. No chest pain  Or shortness of breath . Taking tylenol as well. She is on a blood thinner, history of afib. She is from [place] originally and has been here for 3 months, returns to [place] next month.    ROS per HPI, negative if not otherwise mentioned.      Past Medical History:  Diagnosis Date   Asthma    CHF (congestive heart failure) (HCC)    History of atrial fibrillation    History of myocardial infarction    Hypertension    MI (myocardial infarction) (HCC)     There are no problems to display for this patient.   Past Surgical History:  Procedure Laterality Date   ABDOMINAL HYSTERECTOMY     AORTIC VALVE REPLACEMENT  2014   APPENDECTOMY     CHOLECYSTECTOMY     PACEMAKER PLACEMENT   2014   TONSILLECTOMY      OB History   No obstetric history on file.      Home Medications    Prior to Admission medications   Medication Sig Start Date End Date Taking? Authorizing Provider  acetaminophen (TYLENOL) 500 MG tablet Take 1 tablet (500 mg total) by mouth every 6 (six) hours as needed. 12/02/19  Yes Lamptey, Britta Mccreedy, MD  albuterol (PROVENTIL HFA;VENTOLIN HFA) 108 (90 Base) MCG/ACT inhaler Inhale 2 puffs into the lungs every 6 (six) hours as needed for wheezing or shortness of breath. 05/25/16  Yes Phineas Semen, MD  amiodarone (PACERONE) 200 MG tablet Take 200 mg by mouth daily.   Yes [provider]  apixaban (ELIQUIS) 5 MG TABS tablet TK 1 T PO BID 11/03/16  Yes [provider]  atorvastatin (LIPITOR) 20 MG tablet Take 20 mg by mouth daily.   Yes [provider]  carvedilol (COREG) 6.25 MG tablet Take 6.25 mg by mouth 2 (two) times daily with a meal.   Yes [provider]  clopidogrel (PLAVIX) 75 MG tablet Take 75 mg by mouth daily.   Yes [provider]  furosemide (LASIX) 40 MG tablet Take 40 mg by mouth 3 (three) times a week. Mondays, Wednesdays and Fridays.   Yes [provider]  lisinopril (PRINIVIL,ZESTRIL)  5 MG tablet Take 5 mg by mouth daily.   Yes [provider]  omeprazole (PRILOSEC) 20 MG capsule Take 20 mg by mouth daily.   Yes [provider]  Potassium Chloride ER 20 MEQ TBCR Take 40 mEq by mouth 2 (two) times daily. 3 times a week. Monday, Wednesday, and Friday. When taking furosemide.   Yes [provider]  sertraline (ZOLOFT) 25 MG tablet Take 25 mg by mouth daily.   Yes [provider]  traMADol (ULTRAM) 50 MG tablet Take 1 tablet (50 mg total) by mouth every 12 (twelve) hours as needed. 11/14/19  Yes Cook, Jayce G, DO  tiZANidine (ZANAFLEX) 2 MG tablet Take 1 tablet (2 mg total) by mouth every 8 (eight) hours as needed for muscle spasms. 12/06/19   Zigmund Gottron,  NP  sotalol (BETAPACE) 120 MG tablet Take 120 mg by mouth 2 (two) times daily.  06/05/19  [provider]    Family History History reviewed. No pertinent family history.  Social History Social History   Tobacco Use   Smoking status: Never Smoker   Smokeless tobacco: Never Used  Substance Use Topics   Alcohol use: No   Drug use: No     Allergies   Cortizone-10 [hydrocortisone] and Penicillins   Review of Systems Review of Systems   Physical Exam Triage Vital Signs ED Triage Vitals  Enc Vitals Group     BP 12/06/19 1614 (!) 147/66     Pulse Rate 12/06/19 1614 60     Resp 12/06/19 1614 18     Temp 12/06/19 1614 98.3 F (36.8 C)     Temp Source 12/06/19 1614 Oral     SpO2 12/06/19 1614 98 %     Weight 12/06/19 1611 145 lb 8.1 oz (66 kg)     Height 12/06/19 1611 5\' 2"  (1.575 m)     Head Circumference --      Peak Flow --      Pain Score 12/06/19 1611 8     Pain Loc --      Pain Edu? --      Excl. in [place]? --    No data found.  Updated Vital Signs BP (!) 147/66 (BP Location: Right Arm)    Pulse 60    Temp 98.3 F (36.8 C) (Oral)    Resp 18    Ht 5\' 2"  (1.575 m)    Wt 145 lb 8.1 oz (66 kg)    SpO2 98%    BMI 26.61 kg/m    Physical Exam Constitutional:      General: She is not in acute distress.    Appearance: She is well-developed.  Cardiovascular:     Rate and Rhythm: Normal rate.  Pulmonary:     Effort: Pulmonary effort is normal.  Musculoskeletal:     Thoracic back: Tenderness present. No bony tenderness.     Lumbar back: Tenderness present. No bony tenderness. Normal range of motion.       Back:     Comments: Distal thoracic and lumbar musculature to left side back with tenderness on palpation; no visible rash; full ROM of upper and lower extremities without difficulty; strength equal bilaterally; gross sensation intact to upper and lower extremities   Skin:    General: Skin is warm and dry.  Neurological:     Mental Status: She is alert  and oriented to person, place, and time.      UC Treatments / Results  Labs (  all labs ordered are listed, but only abnormal results are displayed) Labs Reviewed - No data to display  EKG   Radiology No results found.  Procedures Procedures (including critical care time)  Medications Ordered in UC Medications - No data to display  Initial Impression / Assessment and Plan / UC Course  I have reviewed the triage vital signs and the nursing notes.  Pertinent labs & imaging results that were available during my care of the patient were reviewed by me and considered in my medical decision making (see chart for details).     Recent work up here in urgent care with minimal improvement of muscle spasms with medications prescribed. Opted to try tizanidine with drowsy precautions provided. Return precautions provided. Patient verbalized understanding and agreeable to plan.   Final Clinical Impressions(s) / UC Diagnoses   Final diagnoses:  Left-sided low back pain without sciatica, unspecified chronicity  Muscle spasm of back     Discharge Instructions     Please stop the baclofen and tramadol previously prescribed.  Continue with scheduled tylenol. Heat application as needed.  You may try the muscle relaxer I have provided, every 8 hours as needed, to try to help with spasm. Be mindful that it may cause some drowsiness.  Please follow up with your primary care provider for recheck if symptoms persist.  Please return if worsens.    ED Prescriptions    Medication Sig Dispense Auth. Provider   tiZANidine (ZANAFLEX) 2 MG tablet Take 1 tablet (2 mg total) by mouth every 8 (eight) hours as needed for muscle spasms. 20 tablet Georgetta Haber, NP     PDMP not reviewed this encounter.   Georgetta Haber, NP 12/06/19 1754

## 2019-12-06 NOTE — Discharge Instructions (Signed)
Please stop the baclofen and tramadol previously prescribed.  Continue with scheduled tylenol. Heat application as needed.  You may try the muscle relaxer I have provided, every 8 hours as needed, to try to help with spasm. Be mindful that it may cause some drowsiness.  Please follow up with your primary care provider for recheck if symptoms persist.  Please return if worsens.

## 2019-12-06 NOTE — ED Triage Notes (Signed)
Patient states that she is back today for back pain. States that she was seen on Monday and was given Baclofen but has not provided any relief. Patient states that she only gets relief by sitting in her chair with a heating pad.

## 2019-12-07 ENCOUNTER — Other Ambulatory Visit: Payer: Self-pay | Admitting: Family Medicine

## 2020-04-26 DEATH — deceased
